# Patient Record
Sex: Male | Born: 2000 | Race: Black or African American | Hispanic: No | Marital: Single | Smoking: Never smoker
Health system: Southern US, Community
[De-identification: ages and names within clinical notes are randomized; demographics above are authoritative.]

## PROBLEM LIST (undated history)

## (undated) DIAGNOSIS — Z9109 Other allergy status, other than to drugs and biological substances: Secondary | ICD-10-CM

## (undated) DIAGNOSIS — S82002A Unspecified fracture of left patella, initial encounter for closed fracture: Secondary | ICD-10-CM

## (undated) DIAGNOSIS — F913 Oppositional defiant disorder: Secondary | ICD-10-CM

## (undated) DIAGNOSIS — F909 Attention-deficit hyperactivity disorder, unspecified type: Secondary | ICD-10-CM

## (undated) HISTORY — DX: Unspecified fracture of left patella, initial encounter for closed fracture: S82.002A

## (undated) HISTORY — DX: Oppositional defiant disorder: F91.3

## (undated) HISTORY — DX: Other allergy status, other than to drugs and biological substances: Z91.09

---
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
## 2019-01-20 ENCOUNTER — Other Ambulatory Visit: Payer: Self-pay

## 2019-01-20 DIAGNOSIS — Z20822 Contact with and (suspected) exposure to covid-19: Secondary | ICD-10-CM

## 2019-01-21 LAB — NOVEL CORONAVIRUS, NAA: SARS-CoV-2, NAA: NOT DETECTED

## 2019-04-01 ENCOUNTER — Other Ambulatory Visit: Payer: Self-pay

## 2019-04-01 ENCOUNTER — Encounter (HOSPITAL_COMMUNITY): Payer: Self-pay | Admitting: Emergency Medicine

## 2019-04-01 ENCOUNTER — Emergency Department (HOSPITAL_COMMUNITY)
Admission: EM | Admit: 2019-04-01 | Discharge: 2019-04-01 | Discharge status: Against Medical Advice | Payer: Medicaid Other | Attending: Emergency Medicine | Admitting: Emergency Medicine

## 2019-04-01 DIAGNOSIS — Y9389 Activity, other specified: Secondary | ICD-10-CM | POA: Insufficient documentation

## 2019-04-01 DIAGNOSIS — Z5321 Procedure and treatment not carried out due to patient leaving prior to being seen by health care provider: Secondary | ICD-10-CM | POA: Diagnosis not present

## 2019-04-01 DIAGNOSIS — S61011A Laceration without foreign body of right thumb without damage to nail, initial encounter: Secondary | ICD-10-CM | POA: Diagnosis present

## 2019-04-01 DIAGNOSIS — Y929 Unspecified place or not applicable: Secondary | ICD-10-CM | POA: Insufficient documentation

## 2019-04-01 DIAGNOSIS — W268XXA Contact with other sharp object(s), not elsewhere classified, initial encounter: Secondary | ICD-10-CM | POA: Insufficient documentation

## 2019-04-01 DIAGNOSIS — Y999 Unspecified external cause status: Secondary | ICD-10-CM | POA: Diagnosis not present

## 2019-04-01 NOTE — ED Notes (Signed)
Pt states he is leaving because he has to be at work at [employer].  He thought he could be discharged quickly.  Explained there are a couple people ahead of him for fast track.  States he will come back later because he can't lose his job.  Explained to pt that if he needs stitches that the doctor will not want him to wait that long.  He states he is leaving anyways and can't wait.

## 2019-04-01 NOTE — ED Triage Notes (Signed)
Approx  1 1/2 inch laceration to R thumb from metal while he was working on a car around 1pm.  Bleeding controlled pta.  Unknown DT.

## 2020-11-04 ENCOUNTER — Ambulatory Visit (HOSPITAL_COMMUNITY)
Admission: EM | Admit: 2020-11-04 | Discharge: 2020-11-04 | Disposition: A | Discharge status: Home / Self Care | Payer: Medicaid Other | Attending: Medical Oncology | Admitting: Medical Oncology

## 2020-11-04 ENCOUNTER — Encounter (HOSPITAL_COMMUNITY): Payer: Self-pay

## 2020-11-04 ENCOUNTER — Other Ambulatory Visit: Payer: Self-pay

## 2020-11-04 DIAGNOSIS — M546 Pain in thoracic spine: Secondary | ICD-10-CM

## 2020-11-04 DIAGNOSIS — M62838 Other muscle spasm: Secondary | ICD-10-CM

## 2020-11-04 MED ORDER — METHOCARBAMOL 500 MG PO TABS: 500.0000 mg | ORAL_TABLET | Freq: Two times a day (BID) | ORAL | 0 refills | Status: AC

## 2020-11-04 NOTE — ED Provider Notes (Signed)
[facility]    CSN: [id number] Arrival date & time: 11/04/20  1442      History   Chief Complaint Chief Complaint  Patient presents with   Back Pain    HPI Mervin Ramires is a 20 y.o. male.   HPI  Back Pain: Pt reports that she has had mid to upper back pain for the past few years. No known injury. Symptoms worsened this week without known injury. Symptoms hurt on midline and on the sides by the spine. He has tried marijuana for symptoms which helps. No loss of grip strength, headache, bladder/bowel changes.   Past Medical History:  Diagnosis Date   ADHD (attention deficit hyperactivity disorder)    Closed fracture of left patella 10/09/2013   seen at [facility] ED   Oppositional defiant disorder    Pollen allergies     Patient Active Problem List   Diagnosis Date Noted   Abdominal pain 06/25/2018   ADHD (attention deficit hyperactivity disorder) 01/28/2014   History of patellar fracture 01/28/2014    History reviewed. No pertinent surgical history.   Home Medications    Prior to Admission medications   Medication Sig Start Date End Date Taking? Authorizing Provider  methylPREDNISolone (MEDROL DOSEPAK) 4 MG TBPK tablet tad 12/19/18   Eustace Moore, MD    Family History Family History  Problem Relation Age of Onset   Mental illness Father        Mom not sure-either schizophrenia or bipolar   Hypertension Maternal Grandmother    Diabetes Paternal Aunt     Social History Social History   Tobacco Use   Smoking status: Never   Smokeless tobacco: Never  Vaping Use   Vaping Use: Never used  Substance Use Topics   Alcohol use: Not Currently   Drug use: Yes    Types: Marijuana    Comment: occasionally smokes marijuana     Allergies   Patient has no known allergies.   Review of Systems Review of Systems  As stated above in HPI Physical Exam Triage Vital Signs ED Triage Vitals  Enc Vitals Group     BP 11/04/20 1459 126/76     Pulse  Rate 11/04/20 1459 64     Resp 11/04/20 1459 18     Temp 11/04/20 1459 98.9 F (37.2 C)     Temp src --      SpO2 11/04/20 1459 100 %     Weight --      Height --      Head Circumference --      Peak Flow --      Pain Score 11/04/20 1454 7     Pain Loc --      Pain Edu? --      Excl. in GC? --    No data found.  Updated Vital Signs BP 126/76   Pulse 64   Temp 98.9 F (37.2 C)   Resp 18   SpO2 100%   Physical Exam Vitals and nursing note reviewed.  Constitutional:      General: He is not in acute distress.    Appearance: Normal appearance. He is not ill-appearing, toxic-appearing or diaphoretic.  HENT:     Head: Normocephalic and atraumatic.  Eyes:     Extraocular Movements: Extraocular movements intact.     Pupils: Pupils are equal, round, and reactive to light.  Musculoskeletal:     Cervical back: Normal range of motion and neck supple. No rigidity or tenderness.  Skin:    General: Skin is warm.  Neurological:     General: No focal deficit present.     Mental Status: He is alert.     Sensory: No sensory deficit.     Motor: No weakness.     Coordination: Coordination normal.     Gait: Gait normal.     Deep Tendon Reflexes: Reflexes normal.  Psychiatric:        Mood and Affect: Mood normal.        Behavior: Behavior normal.     UC Treatments / Results  Labs (all labs ordered are listed, but only abnormal results are displayed) Labs Reviewed - No data to display  EKG   Radiology No results found.  Procedures Procedures (including critical care time)  Medications Ordered in UC Medications - No data to display  Initial Impression / Assessment and Plan / UC Course  I have reviewed the triage vital signs and the nursing notes.  Pertinent labs & imaging results that were available during my care of the patient were reviewed by me and considered in my medical decision making (see chart for details).     New.  We discussed x-rays given he does have  scant midline tenderness throughout the spine however he declines at this time would like to trial muscle relaxers first.  I also discussed correct posture that may aid in reducing his symptoms as well as some stretches Final Clinical Impressions(s) / UC Diagnoses   Final diagnoses:  None   Discharge Instructions   None    ED Prescriptions   None    PDMP not reviewed this encounter.   Rushie Chestnut, [place] 11/04/20 1542

## 2020-11-04 NOTE — ED Triage Notes (Signed)
Pt reports pain from upper to middle of back starting several years ago and worsening yesterday.

## 2021-07-04 ENCOUNTER — Emergency Department (HOSPITAL_COMMUNITY): Payer: Medicaid Other

## 2021-07-04 ENCOUNTER — Emergency Department (HOSPITAL_COMMUNITY)
Admission: EM | Admit: 2021-07-04 | Discharge: 2021-07-04 | Disposition: A | Discharge status: Home / Self Care | Payer: Medicaid Other | Attending: Emergency Medicine | Admitting: Emergency Medicine

## 2021-07-04 ENCOUNTER — Other Ambulatory Visit: Payer: Self-pay

## 2021-07-04 ENCOUNTER — Encounter (HOSPITAL_COMMUNITY): Payer: Self-pay

## 2021-07-04 DIAGNOSIS — R109 Unspecified abdominal pain: Secondary | ICD-10-CM | POA: Diagnosis not present

## 2021-07-04 DIAGNOSIS — U071 COVID-19: Secondary | ICD-10-CM | POA: Diagnosis not present

## 2021-07-04 DIAGNOSIS — R1084 Generalized abdominal pain: Secondary | ICD-10-CM | POA: Diagnosis not present

## 2021-07-04 DIAGNOSIS — R112 Nausea with vomiting, unspecified: Secondary | ICD-10-CM | POA: Diagnosis not present

## 2021-07-04 LAB — COMPREHENSIVE METABOLIC PANEL
ALT: 39 U/L (ref 0–44)
AST: 49 U/L — ABNORMAL HIGH (ref 15–41)
Albumin: 4.4 g/dL (ref 3.5–5.0)
Alkaline Phosphatase: 67 U/L (ref 38–126)
Anion gap: 8 (ref 5–15)
BUN: 14 mg/dL (ref 6–20)
CO2: 27 mmol/L (ref 22–32)
Calcium: 8.9 mg/dL (ref 8.9–10.3)
Chloride: 104 mmol/L (ref 98–111)
Creatinine, Ser: 1.03 mg/dL (ref 0.61–1.24)
GFR, Estimated: >60 mL/min (ref >60)
Glucose, Bld: 100 mg/dL — ABNORMAL HIGH (ref 70–99)
Potassium: 3.7 mmol/L (ref 3.5–5.1)
Sodium: 139 mmol/L (ref 135–145)
Total Bilirubin: 0.8 mg/dL (ref 0.3–1.2)
Total Protein: 7.6 g/dL (ref 6.5–8.1)

## 2021-07-04 LAB — CBC WITH DIFFERENTIAL/PLATELET
Abs Immature Granulocytes: 0.09 10*3/uL — ABNORMAL HIGH (ref 0.00–0.07)
Basophils Absolute: 0 10*3/uL (ref 0.0–0.1)
Basophils Relative: 0 %
Eosinophils Absolute: 0 10*3/uL (ref 0.0–0.5)
Eosinophils Relative: 0 %
HCT: 47.9 % (ref 39.0–52.0)
Hemoglobin: 15.8 g/dL (ref 13.0–17.0)
Immature Granulocytes: 1 %
Lymphocytes Relative: 22 %
Lymphs Abs: 2.1 10*3/uL (ref 0.7–4.0)
MCH: 30.3 pg (ref 26.0–34.0)
MCHC: 33 g/dL (ref 30.0–36.0)
MCV: 91.8 fL (ref 80.0–100.0)
Monocytes Absolute: 0.8 10*3/uL (ref 0.1–1.0)
Monocytes Relative: 9 %
Neutro Abs: 6.3 10*3/uL (ref 1.7–7.7)
Neutrophils Relative %: 68 %
Platelets: 277 10*3/uL (ref 150–400)
RBC: 5.22 MIL/uL (ref 4.22–5.81)
RDW: 11.9 % (ref 11.5–15.5)
WBC: 9.3 10*3/uL (ref 4.0–10.5)
nRBC: 0 % (ref 0.0–0.2)

## 2021-07-04 LAB — URINALYSIS, ROUTINE W REFLEX MICROSCOPIC
Bilirubin Urine: NEGATIVE
Glucose, UA: NEGATIVE mg/dL
Hgb urine dipstick: NEGATIVE
Ketones, ur: 20 mg/dL — AB
Leukocytes,Ua: NEGATIVE
Nitrite: NEGATIVE
Protein, ur: NEGATIVE mg/dL
Specific Gravity, Urine: <1.005 — ABNORMAL LOW (ref 1.005–1.030)
pH: 6 (ref 5.0–8.0)

## 2021-07-04 LAB — LIPASE, BLOOD: Lipase: 24 U/L (ref 11–51)

## 2021-07-04 MED ORDER — ONDANSETRON HCL 4 MG/2ML IJ SOLN
4.0000 mg | Freq: Once | INTRAMUSCULAR | Status: AC
  Administered 2021-07-04: 4 mg via INTRAVENOUS
  Filled 2021-07-04: qty 2

## 2021-07-04 MED ORDER — IOHEXOL 350 MG/ML SOLN
100.0000 mL | Freq: Once | INTRAVENOUS | Status: AC | PRN
  Administered 2021-07-04: 100 mL via INTRAVENOUS

## 2021-07-04 MED ORDER — SODIUM CHLORIDE 0.9 % IV BOLUS
1000.0000 mL | Freq: Once | INTRAVENOUS | Status: AC
  Administered 2021-07-04: 1000 mL via INTRAVENOUS

## 2021-07-04 MED ORDER — ONDANSETRON 4 MG PO TBDP
4.0000 mg | ORAL_TABLET | Freq: Three times a day (TID) | ORAL | 0 refills | Status: AC | PRN
Start: 2021-07-04 — End: ?

## 2021-07-04 MED ORDER — IOHEXOL 300 MG/ML  SOLN: 100.0000 mL | Freq: Once | INTRAMUSCULAR | Status: DC | PRN

## 2021-07-04 MED ORDER — IOHEXOL 350 MG/ML SOLN: 100.0000 mL | Freq: Once | INTRAVENOUS | Status: DC | PRN

## 2021-07-04 MED ORDER — DICYCLOMINE HCL 20 MG PO TABS: 20.0000 mg | ORAL_TABLET | Freq: Two times a day (BID) | ORAL | 0 refills | Status: DC

## 2021-07-04 MED ORDER — IOHEXOL 9 MG/ML PO SOLN: 500.0000 mL | ORAL | Status: AC

## 2021-07-04 NOTE — Discharge Instructions (Addendum)
It was a pleasure taking care of you today. As discussed your CT scan was normal. I am sending you home with nausea medication and pain medication. Take as needed. Follow-up with PCP if symptoms do not improve over the next week. Return to the ER for new or worsening symptoms.

## 2021-07-04 NOTE — ED Provider Triage Note (Signed)
Emergency Medicine Provider Triage Evaluation Note  [facility] , a 21 y.o. male  was evaluated in triage.  Pt complains of left sided abdominal pain x1 week. He endorses 3 episodes of NBNB emesis today. No fever or chills. Denies urinary symptoms. No previous abdominal operations. Admits to frequent marijuana use.   Review of Systems  Positive: Abdominal pain Negative: fever  Physical Exam  BP 134/80 (BP Location: Left Arm)    Pulse 74    Temp 98.5 F (36.9 C) (Oral)    Resp 20    Ht 6' (1.829 m)    Wt 122.5 kg    SpO2 97%    BMI 36.62 kg/m  Gen:   Awake, no distress   Resp:  Normal effort  MSK:   Moves extremities without difficulty  Other:  TTP on left side of abdomen  Medical Decision Making  Medically screening exam initiated at 6:29 PM.  Appropriate orders placed.  [facility] was informed that the remainder of the evaluation will be completed by another provider, this initial triage assessment does not replace that evaluation, and the importance of remaining in the ED until their evaluation is complete.  Labs CT abdomen   Jesusita Oka 07/04/21 1830

## 2021-07-04 NOTE — ED Triage Notes (Signed)
"  Abdominal aching generalized pretty constant x 1 week, vomited x 3 today" per pt

## 2021-07-04 NOTE — ED Provider Notes (Signed)
[facility] DEPT Provider Note   CSN: [id number] Arrival date & time: 07/04/21  1806     History  Chief Complaint  Patient presents with   Abdominal Pain    [facility] is a 21 y.o. male who presents to the ED due to left-sided abdominal pain x1 week.  Abdominal pain associated with nausea and vomiting.  He admits to 3 episodes of nonbloody, nonbilious emesis today.  No fever or chills.  Denies urinary symptoms.  No previous abdominal operations.  Patient admits to frequent marijuana use.  Denies diarrhea.  No fever or chills.     Home Medications Prior to Admission medications   Medication Sig Start Date End Date Taking? Authorizing Provider  dicyclomine (BENTYL) 20 MG tablet Take 1 tablet (20 mg total) by mouth 2 (two) times daily. 07/04/21  Yes Sudiksha Victor C, PA-C  ondansetron (ZOFRAN-ODT) 4 MG disintegrating tablet Take 1 tablet (4 mg total) by mouth every 8 (eight) hours as needed for nausea or vomiting. 07/04/21  Yes Telissa Palmisano, Druscilla Brownie, PA-C  methocarbamol (ROBAXIN) 500 MG tablet Take 1 tablet (500 mg total) by mouth 2 (two) times daily. 11/04/20   Hughie Closs, PA-C  methylPREDNISolone (MEDROL DOSEPAK) 4 MG TBPK tablet tad 12/19/18   Raylene Everts, MD      Allergies    Patient has no known allergies.    Review of Systems   Review of Systems  Constitutional:  Negative for chills and fever.  Respiratory:  Negative for shortness of breath.   Cardiovascular:  Negative for chest pain.  Gastrointestinal:  Positive for abdominal pain, nausea and vomiting. Negative for diarrhea.   Physical Exam Updated Vital Signs BP 139/83    Pulse (!) 57    Temp 98.5 F (36.9 C) (Oral)    Resp 18    Ht 6' (1.829 m)    Wt 122.5 kg    SpO2 98%    BMI 36.62 kg/m  Physical Exam Vitals and nursing note reviewed.  Constitutional:      General: He is not in acute distress.    Appearance: He is not ill-appearing.  HENT:     Head: Normocephalic.   Eyes:     Pupils: Pupils are equal, round, and reactive to light.  Cardiovascular:     Rate and Rhythm: Normal rate and regular rhythm.     Pulses: Normal pulses.     Heart sounds: Normal heart sounds. No murmur heard.   No friction rub. No gallop.  Pulmonary:     Effort: Pulmonary effort is normal.     Breath sounds: Normal breath sounds.  Abdominal:     General: Abdomen is flat. There is no distension.     Palpations: Abdomen is soft.     Tenderness: There is abdominal tenderness. There is no guarding or rebound.     Comments: Left-sided tenderness  Musculoskeletal:        General: Normal range of motion.     Cervical back: Neck supple.  Skin:    General: Skin is warm and dry.  Neurological:     General: No focal deficit present.     Mental Status: He is alert.  Psychiatric:        Mood and Affect: Mood normal.        Behavior: Behavior normal.    ED Results / Procedures / Treatments   Labs (all labs ordered are listed, but only abnormal results are displayed) Labs Reviewed  CBC  WITH DIFFERENTIAL/PLATELET - Abnormal; Notable for the following components:      Result Value   Abs Immature Granulocytes 0.09 (*)    All other components within normal limits  COMPREHENSIVE METABOLIC PANEL - Abnormal; Notable for the following components:   Glucose, Bld 100 (*)    AST 49 (*)    All other components within normal limits  URINALYSIS, ROUTINE W REFLEX MICROSCOPIC - Abnormal; Notable for the following components:   Specific Gravity, Urine <1.005 (*)    Ketones, ur 20 (*)    All other components within normal limits  LIPASE, BLOOD    EKG None  Radiology CT ABDOMEN PELVIS W CONTRAST  Result Date: 07/04/2021 CLINICAL DATA:  Left lower quadrant abdominal pain for 1 week EXAM: CT ABDOMEN AND PELVIS WITH CONTRAST TECHNIQUE: Multidetector CT imaging of the abdomen and pelvis was performed using the standard protocol following bolus administration of intravenous contrast.  RADIATION DOSE REDUCTION: This exam was performed according to the departmental dose-optimization program which includes automated exposure control, adjustment of the mA and/or kV according to patient size and/or use of iterative reconstruction technique. CONTRAST:  156mL OMNIPAQUE IOHEXOL 350 MG/ML SOLN COMPARISON:  None. FINDINGS: Lower chest: No acute pleural or parenchymal lung disease. Hepatobiliary: No focal liver abnormality is seen. No gallstones, gallbladder wall thickening, or biliary dilatation. Pancreas: Unremarkable. No pancreatic ductal dilatation or surrounding inflammatory changes. Spleen: Normal in size without focal abnormality. Adrenals/Urinary Tract: Adrenal glands are unremarkable. Kidneys are normal, without renal calculi, focal lesion, or hydronephrosis. Bladder is unremarkable. Stomach/Bowel: No bowel obstruction or ileus. Normal appendix right lower quadrant. No bowel wall thickening or inflammatory change. Vascular/Lymphatic: No significant vascular findings are present. No enlarged abdominal or pelvic lymph nodes. Reproductive: Prostate is unremarkable. Other: No free fluid or free gas.  No abdominal wall hernia. Musculoskeletal: No acute or destructive bony lesions. Reconstructed images demonstrate no additional findings. IMPRESSION: 1. No acute intra-abdominal or intrapelvic process. Electronically Signed   By: Randa Ngo M.D.   On: 07/04/2021 21:37    Procedures Procedures    Medications Ordered in ED Medications  iohexol (OMNIPAQUE) 9 MG/ML oral solution 500 mL (has no administration in time range)  iohexol (OMNIPAQUE) 350 MG/ML injection 100 mL (100 mLs Intravenous Contrast Given 07/04/21 2108)  sodium chloride 0.9 % bolus 1,000 mL (1,000 mLs Intravenous New Bag/Given 07/04/21 2209)  ondansetron (ZOFRAN) injection 4 mg (4 mg Intravenous Given 07/04/21 2209)    ED Course/ Medical Decision Making/ A&P                           Medical Decision Making Amount and/or  Complexity of Data Reviewed Independent Historian: friend    Details: Friend at bedside provided some history Labs: ordered. Decision-making details documented in ED Course. Radiology: ordered and independent interpretation performed. Decision-making details documented in ED Course.  Risk Prescription drug management.   22 year old male presents to the ED due to left-sided abdominal pain x1 week associated with nausea and vomiting.  No previous abdominal operations.  Denies urinary symptoms.  He admits to frequent marijuana use.  Upon arrival, vitals all within normal limits.  Patient in no acute distress.  Tenderness throughout left side of abdomen.  Routine labs and CT abdomen ordered.  IV fluids and Zofran given.  This patient presents to the ED for concern of abdominal pain, this involves an extensive number of treatment options, and is a complaint that carries with it a  high risk of complications and morbidity.  The differential diagnosis includes diverticulitis, bowel obstruction, appendectomy, cholecystitis, cannabinoid hyperemesis syndrome, gastroenteritis, etc  CBC unremarkable.  No leukocytosis and normal hemoglobin.  Lipase normal at 24.  Doubt pancreatitis.  CMP reassuring.  Normal renal function.  No major electrolyte derangements. UA negative for signs of infection. CT abdomen personally reviewed and interpreted which is negative for any acute abnormalities.   Upon reassessment, abdomen soft, non-distended with improvement in tenderness. Low suspicion for acute abdomen given normal CT scan. Patient tolerating po without any difficulties. Considered hospitalization; however given normal CT scan and patient able to tolerate po, felt patient was stable for discharge. Patient discharged with zofran and bentyl. Strict ED precautions discussed with patient. Patient states understanding and agrees to plan. Patient discharged home in no acute distress and stable vitals        Final  Clinical Impression(s) / ED Diagnoses Final diagnoses:  Generalized abdominal pain  Nausea and vomiting, unspecified vomiting type    Rx / DC Orders ED Discharge Orders          Ordered    ondansetron (ZOFRAN-ODT) 4 MG disintegrating tablet  Every 8 hours PRN        07/04/21 2327    dicyclomine (BENTYL) 20 MG tablet  2 times daily        07/04/21 2327              Karie Kirks 07/04/21 2330    Dorie Rank, MD 07/05/21 [phone]

## 2021-07-05 ENCOUNTER — Telehealth: Payer: Self-pay | Admitting: *Deleted

## 2021-07-05 NOTE — Telephone Encounter (Signed)
Transition Care Management Unsuccessful Follow-up Telephone Call  Date of discharge and from where:  07/04/2021 - [facility]  Attempts:  1st Attempt  Reason for unsuccessful TCM follow-up call:  Left voice message

## 2021-07-06 NOTE — Telephone Encounter (Signed)
Transition Care Management Unsuccessful Follow-up Telephone Call  Date of discharge and from where:  07/04/2021 from [facility]  Attempts:  2nd Attempt  Reason for unsuccessful TCM follow-up call:  Left voice message

## 2021-07-07 ENCOUNTER — Other Ambulatory Visit: Payer: Self-pay

## 2021-07-07 ENCOUNTER — Emergency Department (HOSPITAL_BASED_OUTPATIENT_CLINIC_OR_DEPARTMENT_OTHER)
Admission: EM | Admit: 2021-07-07 | Discharge: 2021-07-07 | Disposition: A | Discharge status: Home / Self Care | Payer: Medicaid Other | Attending: Emergency Medicine | Admitting: Emergency Medicine

## 2021-07-07 ENCOUNTER — Encounter (HOSPITAL_BASED_OUTPATIENT_CLINIC_OR_DEPARTMENT_OTHER): Payer: Self-pay | Admitting: Emergency Medicine

## 2021-07-07 DIAGNOSIS — R197 Diarrhea, unspecified: Secondary | ICD-10-CM | POA: Insufficient documentation

## 2021-07-07 DIAGNOSIS — R1012 Left upper quadrant pain: Secondary | ICD-10-CM | POA: Diagnosis not present

## 2021-07-07 DIAGNOSIS — R1013 Epigastric pain: Secondary | ICD-10-CM | POA: Diagnosis present

## 2021-07-07 LAB — COMPREHENSIVE METABOLIC PANEL
ALT: 46 U/L — ABNORMAL HIGH (ref 0–44)
AST: 31 U/L (ref 15–41)
Albumin: 4.6 g/dL (ref 3.5–5.0)
Alkaline Phosphatase: 61 U/L (ref 38–126)
Anion gap: 7 (ref 5–15)
BUN: 9 mg/dL (ref 6–20)
CO2: 29 mmol/L (ref 22–32)
Calcium: 9.6 mg/dL (ref 8.9–10.3)
Chloride: 104 mmol/L (ref 98–111)
Creatinine, Ser: 1.11 mg/dL (ref 0.61–1.24)
GFR, Estimated: >60 mL/min (ref >60)
Glucose, Bld: 102 mg/dL — ABNORMAL HIGH (ref 70–99)
Potassium: 3.5 mmol/L (ref 3.5–5.1)
Sodium: 140 mmol/L (ref 135–145)
Total Bilirubin: 0.8 mg/dL (ref 0.3–1.2)
Total Protein: 7.7 g/dL (ref 6.5–8.1)

## 2021-07-07 LAB — CBC
HCT: 47.3 % (ref 39.0–52.0)
Hemoglobin: 15.5 g/dL (ref 13.0–17.0)
MCH: 29.9 pg (ref 26.0–34.0)
MCHC: 32.8 g/dL (ref 30.0–36.0)
MCV: 91.1 fL (ref 80.0–100.0)
Platelets: 312 10*3/uL (ref 150–400)
RBC: 5.19 MIL/uL (ref 4.22–5.81)
RDW: 11.9 % (ref 11.5–15.5)
WBC: 5.3 10*3/uL (ref 4.0–10.5)
nRBC: 0 % (ref 0.0–0.2)

## 2021-07-07 LAB — LIPASE, BLOOD: Lipase: 13 U/L (ref 11–51)

## 2021-07-07 MED ORDER — OMEPRAZOLE 20 MG PO CPDR: 20.0000 mg | DELAYED_RELEASE_CAPSULE | Freq: Every day | ORAL | 0 refills | Status: AC

## 2021-07-07 MED ORDER — ALUM & MAG HYDROXIDE-SIMETH 200-200-20 MG/5ML PO SUSP
30.0000 mL | Freq: Once | ORAL | Status: AC
  Administered 2021-07-07: 30 mL via ORAL
  Filled 2021-07-07: qty 30

## 2021-07-07 MED ORDER — LIDOCAINE VISCOUS HCL 2 % MT SOLN
15.0000 mL | Freq: Once | OROMUCOSAL | Status: AC
  Administered 2021-07-07: 15 mL via ORAL
  Filled 2021-07-07: qty 15

## 2021-07-07 MED ORDER — LOPERAMIDE HCL 2 MG PO CAPS: 2.0000 mg | ORAL_CAPSULE | Freq: Four times a day (QID) | ORAL | 0 refills | Status: AC | PRN

## 2021-07-07 NOTE — Telephone Encounter (Signed)
Transition Care Management Follow-up Telephone Call Date of discharge and from where: 07/04/2021 from [facility] How have you been since you were released from the hospital? Patient stated that he is still having diarrhea and vomiting. Patient stated that he has not been able to keep anything down. Patient stated that he is going to [facility] ED and stated that medication rx'ed at [facility] is not helping and is making his abdominal pain worse.  Any questions or concerns? No  Items Reviewed: Did the pt receive and understand the discharge instructions provided? Yes  Medications obtained and verified? Yes  Other? No  Any new allergies since your discharge? No  Dietary orders reviewed? No Do you have support at home? Yes   Functional Questionnaire: (I = Independent and D = Dependent) ADLs: I  Bathing/Dressing- I  Meal Prep- I  Eating- I  Maintaining continence- I  Transferring/Ambulation- I  Managing Meds- I   Follow up appointments reviewed:  PCP Hospital f/u appt confirmed? No   Specialist Hospital f/u appt confirmed? No   Are transportation arrangements needed? No  If their condition worsens, is the pt aware to call PCP or go to the Emergency Dept.? Yes Was the patient provided with contact information for the PCP's office or ED? Yes Was to pt encouraged to call back with questions or concerns? Yes

## 2021-07-07 NOTE — Discharge Instructions (Signed)
Call your primary care doctor or specialist as discussed in the next 2-3 days.   Return immediately back to the ER if:  Your symptoms worsen within the next 12-24 hours. You develop new symptoms such as new fevers, persistent vomiting, new pain, shortness of breath, or new weakness or numbness, or if you have any other concerns.  

## 2021-07-07 NOTE — ED Triage Notes (Signed)
Pt endorses upper abd pain for a week. Also having diarrhea. States he was seen for same a few days ago and had normal CT. States the medication he was prescribed makes the pain worse.

## 2021-07-07 NOTE — ED Provider Notes (Signed)
[facility] EM[facility] DEPT Provider Note   CSN: [id number] Arrival date & time: 07/07/21  1312     History  Chief Complaint  Patient presents with   Abdominal Pain    [facility] is a 21 y.o. male.  Patient presents to the ER with continued left upper quadrant pain.  Describes it as sharp and persistent.  He states that been ongoing for almost 2 weeks now.  He was seen here in the ER 3 days ago had a negative work-up and discharged home.  He states the pain has improved but continues to linger and he presents back to the ER.  He states that the medications he was prescribed seem to make it worse when he takes them.  Patient describes 1 episode of vomiting yesterday otherwise tolerating oral intake.  He continues to have about 3 episodes of diarrhea a day which is described as nonbloody.  No reports of fevers.  No chest pain no headache.      Home Medications Prior to Admission medications   Medication Sig Start Date End Date Taking? Authorizing Provider  loperamide (IMODIUM) 2 MG capsule Take 1 capsule (2 mg total) by mouth 4 (four) times daily as needed for diarrhea or loose stools. 07/07/21  Yes Luna Fuse, MD  omeprazole (PRILOSEC) 20 MG capsule Take 1 capsule (20 mg total) by mouth daily for 20 days. 07/07/21 07/27/21 Yes Rasheema Truluck, Greggory Brandy, MD  methocarbamol (ROBAXIN) 500 MG tablet Take 1 tablet (500 mg total) by mouth 2 (two) times daily. 11/04/20   Hughie Closs, PA-C  methylPREDNISolone (MEDROL DOSEPAK) 4 MG TBPK tablet tad 12/19/18   Raylene Everts, MD  ondansetron (ZOFRAN-ODT) 4 MG disintegrating tablet Take 1 tablet (4 mg total) by mouth every 8 (eight) hours as needed for nausea or vomiting. 07/04/21   Suzy Bouchard, PA-C      Allergies    Patient has no known allergies.    Review of Systems   Review of Systems  Constitutional:  Negative for fever.  HENT:  Negative for ear pain and sore throat.   Eyes:  Negative for pain.  Respiratory:   Negative for cough.   Cardiovascular:  Negative for chest pain.  Gastrointestinal:  Positive for abdominal pain.  Genitourinary:  Negative for flank pain.  Musculoskeletal:  Negative for back pain.  Skin:  Negative for color change and rash.  Neurological:  Negative for syncope.  All other systems reviewed and are negative.  Physical Exam Updated Vital Signs BP 138/88    Pulse 69    Temp 98.7 F (37.1 C) (Oral)    Resp (!) 22    Ht 6' (1.829 m)    Wt 124.3 kg    SpO2 100%    BMI 37.16 kg/m  Physical Exam Constitutional:      Appearance: He is well-developed.  HENT:     Head: Normocephalic.     Nose: Nose normal.  Eyes:     Extraocular Movements: Extraocular movements intact.  Cardiovascular:     Rate and Rhythm: Normal rate.  Pulmonary:     Effort: Pulmonary effort is normal.  Abdominal:     Tenderness: There is abdominal tenderness in the left upper quadrant. There is no guarding or rebound.  Skin:    Coloration: Skin is not jaundiced.  Neurological:     Mental Status: He is alert. Mental status is at baseline.    ED Results / Procedures / Treatments   Labs (all  labs ordered are listed, but only abnormal results are displayed) Labs Reviewed  COMPREHENSIVE METABOLIC PANEL - Abnormal; Notable for the following components:      Result Value   Glucose, Bld 102 (*)    ALT 46 (*)    All other components within normal limits  LIPASE, BLOOD  CBC    EKG None  Radiology No results found.  Procedures Procedures    Medications Ordered in ED Medications  alum & mag hydroxide-simeth (MAALOX/MYLANTA) 200-200-20 MG/5ML suspension 30 mL (30 mLs Oral Given 07/07/21 1530)    And  lidocaine (XYLOCAINE) 2 % viscous mouth solution 15 mL (15 mLs Oral Given 07/07/21 1530)    ED Course/ Medical Decision Making/ A&P                           Medical Decision Making Amount and/or Complexity of Data Reviewed Labs: ordered.  Risk OTC drugs. Prescription drug  management.   Review of records show office visit on July 04, 2021 for coronavirus evaluation in urgent care.  Vital signs arrival here are within normal limits.  Labs were repeated which appear normal white count normal lipase normal chemistry normal.  Patient is comfortable appearing but still has residual tenderness in the epigastric/left upper quadrant region.  He states that the Bentyl has been taking causes increased cramping and pain and advised him to stop it.  He states he is concerned that he still having some diarrhea, I will prescribe Imodium for him.  Patient tolerating oral intake here and given a GI cocktail with some improvement.  I did consider CT imaging of his abdomen, and discussed this with patient, however he had a CT scan 2 days ago which was normal as well as vital signs and labs which continue to be normal today.  I do not think the benefit of a repeat CT would outweigh the risk of radiation given his presentation today.  Patient advised outpatient follow-up with his doctor again this week.  Given a prescription of Imodium as well as Prilosec to continue at home.  Advised return if he has fevers or cannot keep down any fluids or has worsening symptoms.        Final Clinical Impression(s) / ED Diagnoses Final diagnoses:  Epigastric pain    Rx / DC Orders ED Discharge Orders          Ordered    loperamide (IMODIUM) 2 MG capsule  4 times daily PRN        07/07/21 1546    omeprazole (PRILOSEC) 20 MG capsule  Daily        07/07/21 1546              Luna Fuse, MD 07/07/21 1546

## 2021-07-10 ENCOUNTER — Telehealth: Payer: Self-pay

## 2021-07-10 NOTE — Telephone Encounter (Signed)
Transition Care Management Unsuccessful Follow-up Telephone Call  Date of discharge and from where:  07/07/2020-[facility]  Attempts:  1st Attempt  Reason for unsuccessful TCM follow-up call:  Left voice message

## 2021-07-11 NOTE — Telephone Encounter (Signed)
Transition Care Management Follow-up Telephone Call Date of discharge and from where: 07/07/2021 from [facility] ED How have you been since you were released from the hospital? Patient stated that he is feeling much better. Patient does not have any symptoms at this time.  Any questions or concerns? No  Items Reviewed: Did the pt receive and understand the discharge instructions provided? Yes  Medications obtained and verified? Yes  Other? No  Any new allergies since your discharge? No  Dietary orders reviewed? No Do you have support at home? Yes   Functional Questionnaire: (I = Independent and D = Dependent) ADLs: I  Bathing/Dressing- I  Meal Prep- I  Eating- I  Maintaining continence- I  Transferring/Ambulation- I  Managing Meds- I   Follow up appointments reviewed:  PCP Hospital f/u appt confirmed? No   Specialist Hospital f/u appt confirmed? No   Are transportation arrangements needed? No  If their condition worsens, is the pt aware to call PCP or go to the Emergency Dept.? Yes Was the patient provided with contact information for the PCP's office or ED? Yes Was to pt encouraged to call back with questions or concerns? Yes

## 2022-03-25 ENCOUNTER — Emergency Department (HOSPITAL_BASED_OUTPATIENT_CLINIC_OR_DEPARTMENT_OTHER): Payer: Medicaid Other | Admitting: Radiology

## 2022-03-25 ENCOUNTER — Emergency Department (HOSPITAL_BASED_OUTPATIENT_CLINIC_OR_DEPARTMENT_OTHER)
Admission: EM | Admit: 2022-03-25 | Discharge: 2022-03-25 | Disposition: A | Discharge status: Home / Self Care | Payer: Medicaid Other | Attending: Emergency Medicine | Admitting: Emergency Medicine

## 2022-03-25 ENCOUNTER — Encounter (HOSPITAL_BASED_OUTPATIENT_CLINIC_OR_DEPARTMENT_OTHER): Payer: Self-pay | Admitting: Emergency Medicine

## 2022-03-25 ENCOUNTER — Other Ambulatory Visit: Payer: Self-pay

## 2022-03-25 DIAGNOSIS — I10 Essential (primary) hypertension: Secondary | ICD-10-CM | POA: Diagnosis not present

## 2022-03-25 DIAGNOSIS — R0789 Other chest pain: Secondary | ICD-10-CM | POA: Diagnosis not present

## 2022-03-25 DIAGNOSIS — Z79899 Other long term (current) drug therapy: Secondary | ICD-10-CM | POA: Insufficient documentation

## 2022-03-25 DIAGNOSIS — R079 Chest pain, unspecified: Secondary | ICD-10-CM

## 2022-03-25 LAB — BASIC METABOLIC PANEL
Anion gap: 9 (ref 5–15)
BUN: 12 mg/dL (ref 6–20)
CO2: 27 mmol/L (ref 22–32)
Calcium: 9.9 mg/dL (ref 8.9–10.3)
Chloride: 101 mmol/L (ref 98–111)
Creatinine, Ser: 1.15 mg/dL (ref 0.61–1.24)
GFR, Estimated: >60 mL/min (ref >60)
Glucose, Bld: 78 mg/dL (ref 70–99)
Potassium: 3.4 mmol/L — ABNORMAL LOW (ref 3.5–5.1)
Sodium: 137 mmol/L (ref 135–145)

## 2022-03-25 LAB — CBC
HCT: 47.6 % (ref 39.0–52.0)
Hemoglobin: 15.7 g/dL (ref 13.0–17.0)
MCH: 30.4 pg (ref 26.0–34.0)
MCHC: 33 g/dL (ref 30.0–36.0)
MCV: 92.1 fL (ref 80.0–100.0)
Platelets: 281 10*3/uL (ref 150–400)
RBC: 5.17 MIL/uL (ref 4.22–5.81)
RDW: 11.9 % (ref 11.5–15.5)
WBC: 8.3 10*3/uL (ref 4.0–10.5)
nRBC: 0 % (ref 0.0–0.2)

## 2022-03-25 LAB — TROPONIN I (HIGH SENSITIVITY): Troponin I (High Sensitivity): 4 ng/L (ref <18)

## 2022-03-25 NOTE — ED Triage Notes (Signed)
Left sided chest pain  Started on 03/15/2022 On and off. Episode of dizziness yesterday.

## 2022-03-25 NOTE — ED Provider Notes (Signed)
[facility] EMERGENCY DEPT Provider Note   CSN: [id number] Arrival date & time: 03/25/22  1424     History Chief Complaint  Patient presents with   Chest Pain    Willie Mathews is a 21 y.o. male otherwise healthy presents emerged department for evaluation of chest pains been intermittent for the past 2 weeks.  Patient reports that upon movement this morning upon standing he felt lightheaded.  Denies any nausea, vomiting, diaphoresis, diarrhea, constipation, or abdominal pain.  He denies any shortness of breath. ***   Chest Pain Associated symptoms: no abdominal pain, no cough, no fever, no headache, no nausea, no palpitations, no shortness of breath and no vomiting        Home Medications Prior to Admission medications   Medication Sig Start Date End Date Taking? Authorizing Provider  loperamide (IMODIUM) 2 MG capsule Take 1 capsule (2 mg total) by mouth 4 (four) times daily as needed for diarrhea or loose stools. 07/07/21   Luna Fuse, MD  methocarbamol (ROBAXIN) 500 MG tablet Take 1 tablet (500 mg total) by mouth 2 (two) times daily. 11/04/20   Hughie Closs, PA-C  methylPREDNISolone (MEDROL DOSEPAK) 4 MG TBPK tablet tad 12/19/18   Raylene Everts, MD  omeprazole (PRILOSEC) 20 MG capsule Take 1 capsule (20 mg total) by mouth daily for 20 days. 07/07/21 07/27/21  Luna Fuse, MD  ondansetron (ZOFRAN-ODT) 4 MG disintegrating tablet Take 1 tablet (4 mg total) by mouth every 8 (eight) hours as needed for nausea or vomiting. 07/04/21   Suzy Bouchard, PA-C      Allergies    Bee pollen    Review of Systems   Review of Systems  Constitutional:  Negative for chills and fever.  Respiratory:  Negative for cough and shortness of breath.   Cardiovascular:  Positive for chest pain. Negative for palpitations and leg swelling.  Gastrointestinal:  Negative for abdominal pain, constipation, diarrhea, nausea and vomiting.  Genitourinary:  Negative for dysuria and  hematuria.  Neurological:  Positive for light-headedness. Negative for syncope and headaches.    Physical Exam Updated Vital Signs BP (!) 129/94 (BP Location: Right Arm)   Pulse 81   Temp 98.4 F (36.9 C) (Oral)   Resp 18   Ht 6' (1.829 m)   Wt 122.5 kg   SpO2 100%   BMI 36.62 kg/m  Physical Exam Vitals and nursing note reviewed.     ED Results / Procedures / Treatments   Labs (all labs ordered are listed, but only abnormal results are displayed) Labs Reviewed  BASIC METABOLIC PANEL - Abnormal; Notable for the following components:      Result Value   Potassium 3.4 (*)    All other components within normal limits  CBC  TROPONIN I (HIGH SENSITIVITY)  TROPONIN I (HIGH SENSITIVITY)    EKG None  Radiology DG Chest 2 View  Result Date: 03/25/2022 CLINICAL DATA:  Left chest pain EXAM: CHEST - 2 VIEW COMPARISON:  None Available. FINDINGS: The heart size and mediastinal contours are within normal limits. Both lungs are clear. The visualized skeletal structures are unremarkable. IMPRESSION: No active cardiopulmonary disease. Electronically Signed   By: Elmer Picker M.D.   On: 03/25/2022 15:19    Procedures Procedures   Medications Ordered in ED Medications - No data to display  ED Course/ Medical Decision Making/ A&P  Medical Decision Making Amount and/or Complexity of Data Reviewed Labs: ordered. Radiology: ordered.   ***  21 y/o M presents to the   {Document critical care time when appropriate:1} {Document review of labs and clinical decision tools ie heart score, Chads2Vasc2 etc:1}  {Document your independent review of radiology images, and any outside records:1} {Document your discussion with family members, caretakers, and with consultants:1} {Document social determinants of health affecting pt's care:1} {Document your decision making why or why not admission, treatments were needed:1} Final Clinical Impression(s) / ED  Diagnoses Final diagnoses:  None    Rx / DC Orders ED Discharge Orders     None

## 2022-05-29 DIAGNOSIS — R112 Nausea with vomiting, unspecified: Secondary | ICD-10-CM | POA: Diagnosis not present

## 2022-05-29 DIAGNOSIS — R109 Unspecified abdominal pain: Secondary | ICD-10-CM | POA: Diagnosis not present

## 2022-05-29 DIAGNOSIS — K2960 Other gastritis without bleeding: Secondary | ICD-10-CM | POA: Diagnosis not present

## 2023-05-29 IMAGING — CT CT ABD-PELV W/ CM
2 of 4 series · 16 of 46 positions shown, 18 images · IV contrast (agent unspecified)
Comparison: None.

CLINICAL DATA: Left lower quadrant abdominal pain for 1 week

EXAM:
CT ABDOMEN AND PELVIS WITH CONTRAST
TECHNIQUE: Multidetector CT imaging of the abdomen and pelvis was performed
using the standard protocol following bolus administration of
intravenous contrast.

[Series 2: axial st · axial · 0.84mm/px · z∈[-720,-270]mm · 13 of 100 slices shown, 15 images]
[im 5/100  soft-tissue]
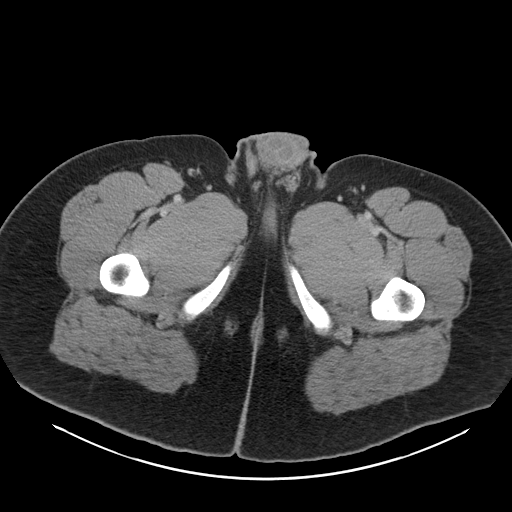
[im 5/100  bone]
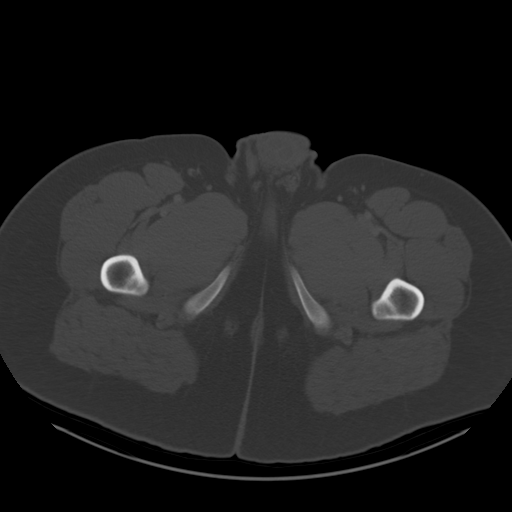
[im 15/100  soft-tissue]
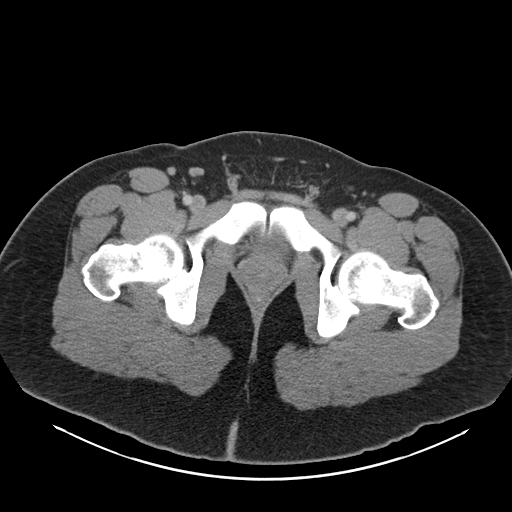
[im 20/100  soft-tissue]
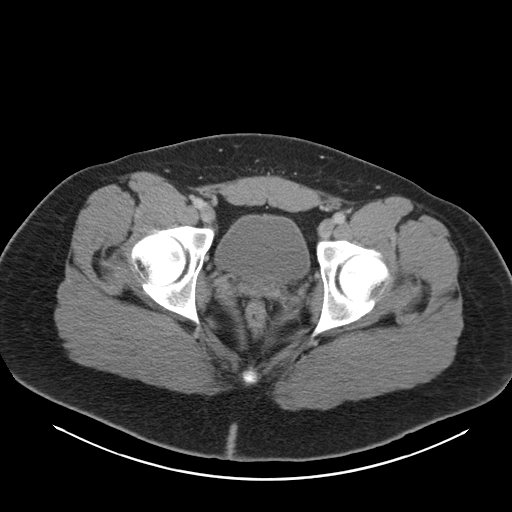
[im 30/100  soft-tissue]
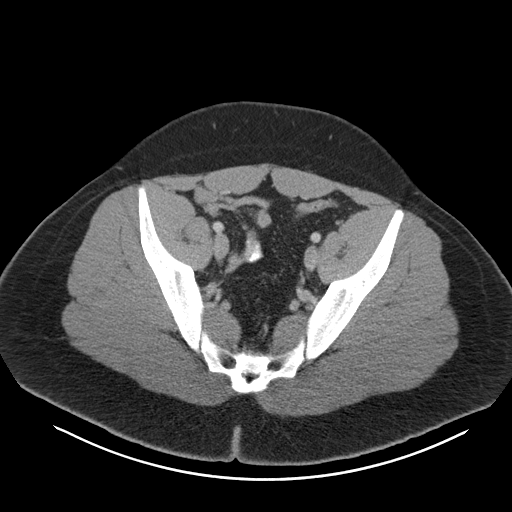
[im 35/100  soft-tissue]
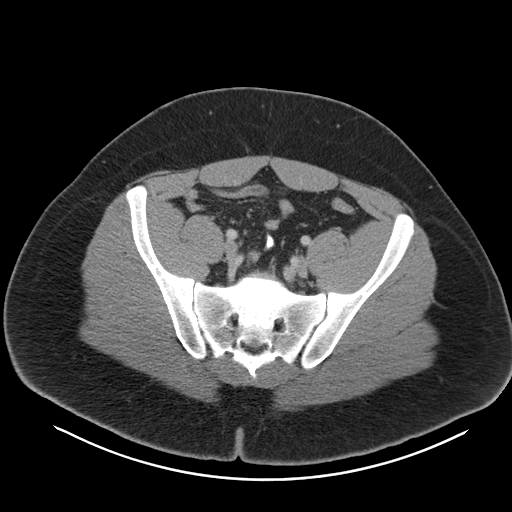
[im 45/100  soft-tissue]
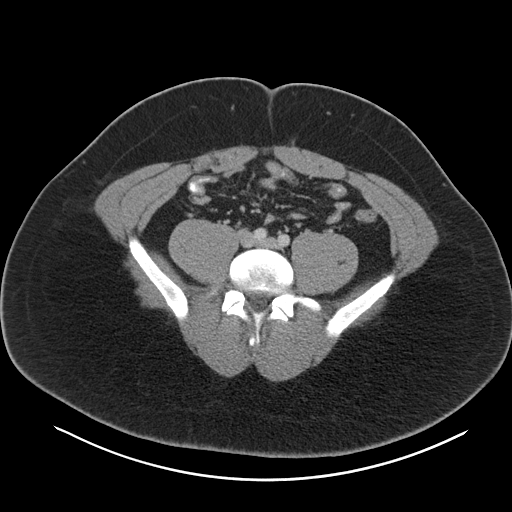
[im 50/100  soft-tissue]
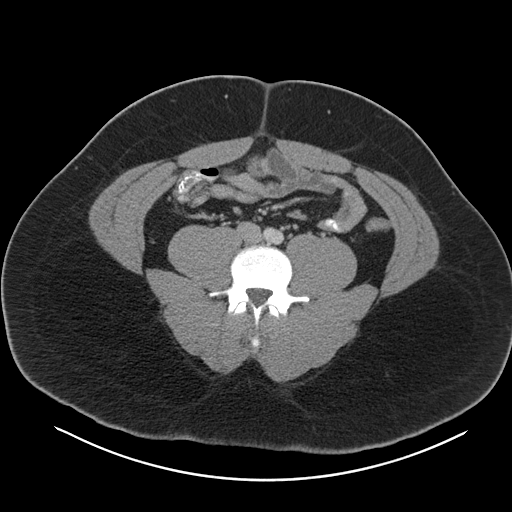
[im 55/100  soft-tissue]
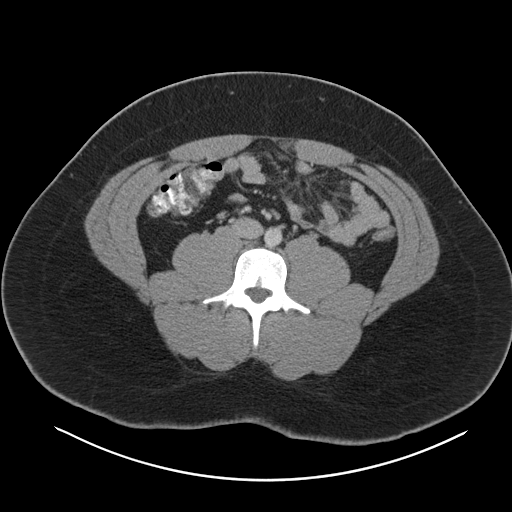
[im 65/100  soft-tissue]
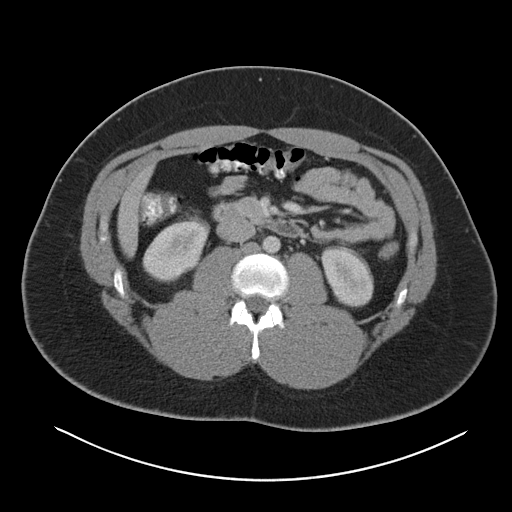
[im 65/100  bone]
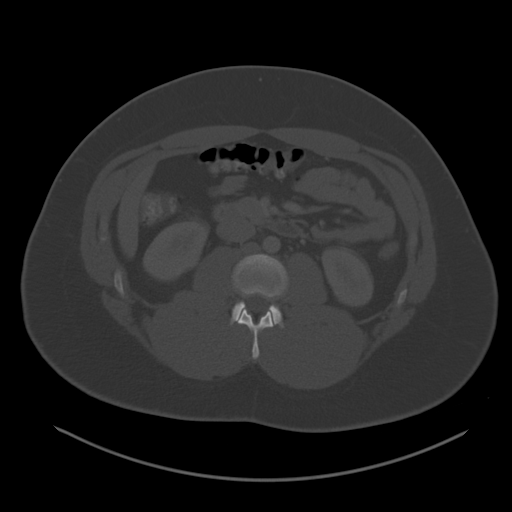
[im 70/100  soft-tissue]
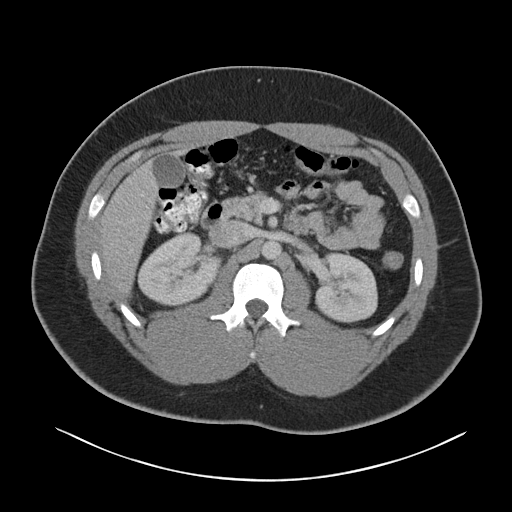
[im 80/100  soft-tissue]
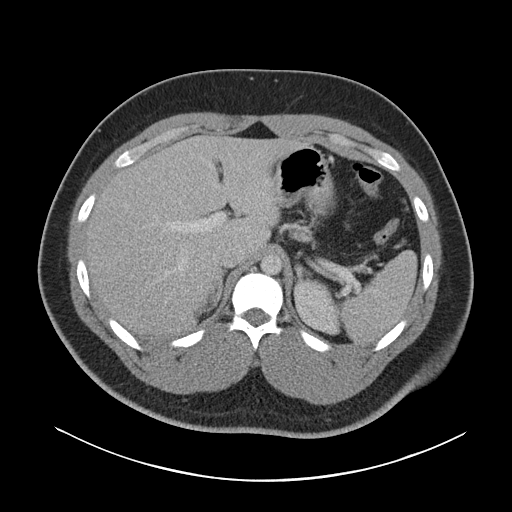
[im 85/100  soft-tissue]
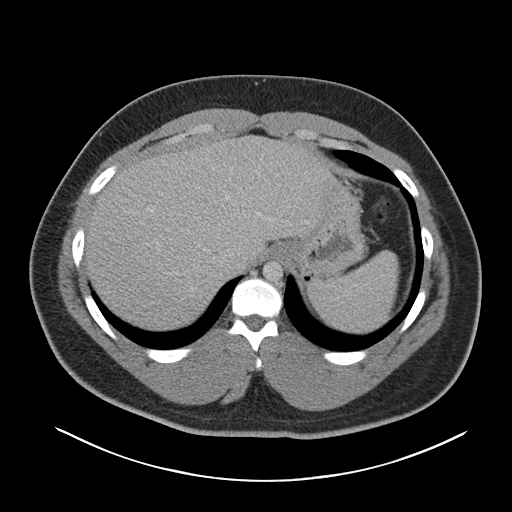
[im 95/100  soft-tissue]
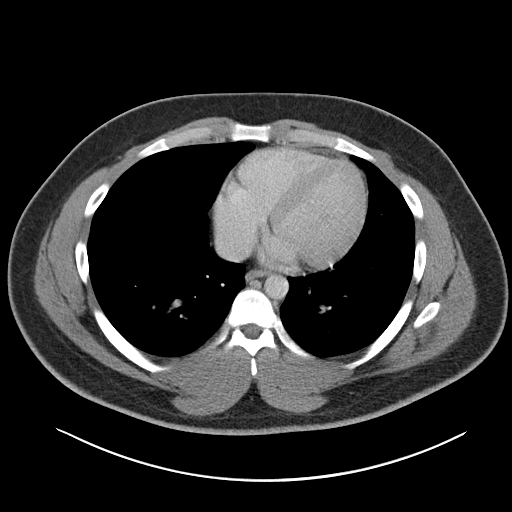

[Series 4: coronal st · coronal · 0.76mm/px · 3 of 155 slices shown]
[im 52/155  soft-tissue]
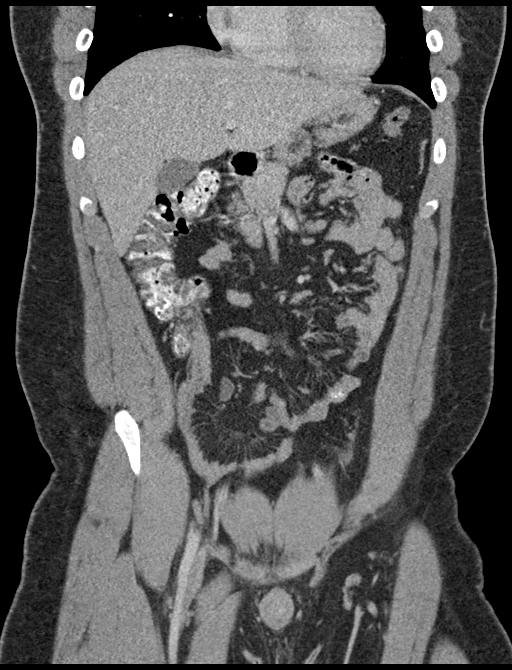
[im 69/155  soft-tissue]
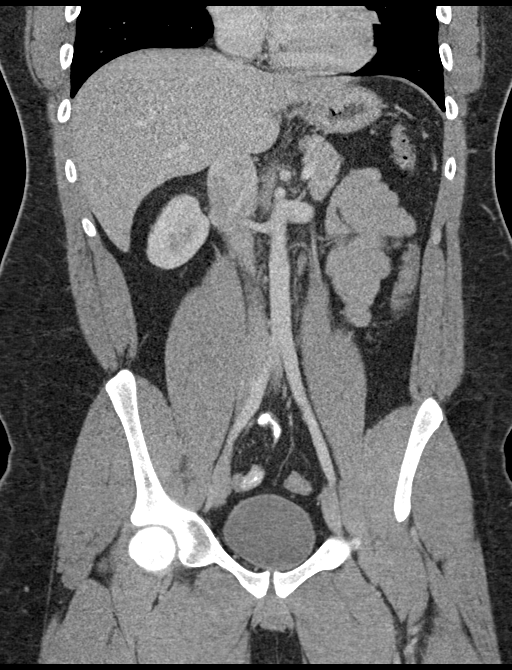
[im 86/155  soft-tissue]
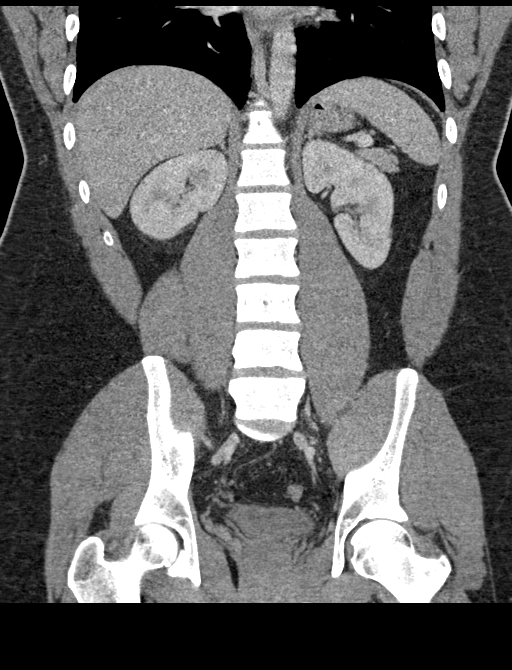

[16 of 46 positions shown; findings below may reference images not displayed]

RADIATION DOSE REDUCTION: This exam was performed according to the
departmental dose-optimization program which includes automated
exposure control, adjustment of the mA and/or kV according to
patient size and/or use of iterative reconstruction technique.

CONTRAST:  100mL OMNIPAQUE IOHEXOL 350 MG/ML SOLN
FINDINGS: Lower chest: No acute pleural or parenchymal lung disease.

Hepatobiliary: No focal liver abnormality is seen. No gallstones,
gallbladder wall thickening, or biliary dilatation.

Pancreas: Unremarkable. No pancreatic ductal dilatation or
surrounding inflammatory changes.

Spleen: Normal in size without focal abnormality.

Adrenals/Urinary Tract: Adrenal glands are unremarkable. Kidneys are
normal, without renal calculi, focal lesion, or hydronephrosis.
Bladder is unremarkable.

Stomach/Bowel: No bowel obstruction or ileus. Normal appendix right
lower quadrant. No bowel wall thickening or inflammatory change.

Vascular/Lymphatic: No significant vascular findings are present. No
enlarged abdominal or pelvic lymph nodes.

Reproductive: Prostate is unremarkable.

Other: No free fluid or free gas.  No abdominal wall hernia.

Musculoskeletal: No acute or destructive bony lesions. Reconstructed
images demonstrate no additional findings.
IMPRESSION: 1. No acute intra-abdominal or intrapelvic process.
